# Patient Record
Sex: Female | Born: 2000 | Race: White | Marital: Single | Smoking: Never smoker
Health system: Southern US, Community
[De-identification: ages and names within clinical notes are randomized; demographics above are authoritative.]

## PROBLEM LIST (undated history)

## (undated) HISTORY — PX: KNEE ARTHROCENTESIS: SHX991

---

## 2020-03-10 DIAGNOSIS — F4321 Adjustment disorder with depressed mood: Secondary | ICD-10-CM | POA: Diagnosis not present

## 2020-03-16 DIAGNOSIS — F4321 Adjustment disorder with depressed mood: Secondary | ICD-10-CM | POA: Diagnosis not present

## 2020-03-21 DIAGNOSIS — J988 Other specified respiratory disorders: Secondary | ICD-10-CM | POA: Diagnosis not present

## 2020-03-23 DIAGNOSIS — F4321 Adjustment disorder with depressed mood: Secondary | ICD-10-CM | POA: Diagnosis not present

## 2020-03-24 DIAGNOSIS — R300 Dysuria: Secondary | ICD-10-CM | POA: Diagnosis not present

## 2020-03-24 DIAGNOSIS — R3129 Other microscopic hematuria: Secondary | ICD-10-CM | POA: Diagnosis not present

## 2020-03-24 DIAGNOSIS — R102 Pelvic and perineal pain: Secondary | ICD-10-CM | POA: Diagnosis not present

## 2020-03-24 DIAGNOSIS — R3914 Feeling of incomplete bladder emptying: Secondary | ICD-10-CM | POA: Diagnosis not present

## 2020-04-06 DIAGNOSIS — F4321 Adjustment disorder with depressed mood: Secondary | ICD-10-CM | POA: Diagnosis not present

## 2020-04-20 DIAGNOSIS — F4321 Adjustment disorder with depressed mood: Secondary | ICD-10-CM | POA: Diagnosis not present

## 2020-05-04 DIAGNOSIS — F4321 Adjustment disorder with depressed mood: Secondary | ICD-10-CM | POA: Diagnosis not present

## 2020-06-21 DIAGNOSIS — F4323 Adjustment disorder with mixed anxiety and depressed mood: Secondary | ICD-10-CM | POA: Diagnosis not present

## 2020-06-26 DIAGNOSIS — F4323 Adjustment disorder with mixed anxiety and depressed mood: Secondary | ICD-10-CM | POA: Diagnosis not present

## 2020-06-30 ENCOUNTER — Ambulatory Visit: Payer: BC Managed Care – PPO | Admitting: Adult Health

## 2020-07-03 DIAGNOSIS — S52124A Nondisplaced fracture of head of right radius, initial encounter for closed fracture: Secondary | ICD-10-CM | POA: Diagnosis not present

## 2020-07-03 DIAGNOSIS — M25421 Effusion, right elbow: Secondary | ICD-10-CM | POA: Diagnosis not present

## 2020-07-04 ENCOUNTER — Other Ambulatory Visit: Payer: Self-pay

## 2020-07-04 ENCOUNTER — Other Ambulatory Visit: Payer: Self-pay | Admitting: Physician Assistant

## 2020-07-04 ENCOUNTER — Ambulatory Visit
Admission: RE | Admit: 2020-07-04 | Discharge: 2020-07-04 | Disposition: A | Discharge status: Home / Self Care | Payer: BC Managed Care – PPO | Source: Ambulatory Visit | Attending: Physician Assistant | Admitting: Physician Assistant

## 2020-07-04 ENCOUNTER — Inpatient Hospital Stay
Admission: RE | Admit: 2020-07-04 | Discharge: 2020-07-04 | Disposition: A | Discharge status: Home / Self Care | Payer: BC Managed Care – PPO | Source: Ambulatory Visit | Attending: Orthopedic Surgery | Admitting: Orthopedic Surgery

## 2020-07-04 ENCOUNTER — Other Ambulatory Visit: Payer: Self-pay | Admitting: Orthopedic Surgery

## 2020-07-04 DIAGNOSIS — M25521 Pain in right elbow: Secondary | ICD-10-CM

## 2020-07-04 DIAGNOSIS — R937 Abnormal findings on diagnostic imaging of other parts of musculoskeletal system: Secondary | ICD-10-CM | POA: Diagnosis not present

## 2020-07-04 DIAGNOSIS — S52121A Displaced fracture of head of right radius, initial encounter for closed fracture: Secondary | ICD-10-CM | POA: Diagnosis not present

## 2020-07-04 DIAGNOSIS — M25021 Hemarthrosis, right elbow: Secondary | ICD-10-CM | POA: Diagnosis not present

## 2020-07-04 DIAGNOSIS — S52131A Displaced fracture of neck of right radius, initial encounter for closed fracture: Secondary | ICD-10-CM | POA: Diagnosis not present

## 2020-07-06 DIAGNOSIS — M25521 Pain in right elbow: Secondary | ICD-10-CM | POA: Diagnosis not present

## 2020-07-07 DIAGNOSIS — F4323 Adjustment disorder with mixed anxiety and depressed mood: Secondary | ICD-10-CM | POA: Diagnosis not present

## 2020-07-13 DIAGNOSIS — F4323 Adjustment disorder with mixed anxiety and depressed mood: Secondary | ICD-10-CM | POA: Diagnosis not present

## 2020-07-18 DIAGNOSIS — F4323 Adjustment disorder with mixed anxiety and depressed mood: Secondary | ICD-10-CM | POA: Diagnosis not present

## 2020-07-20 DIAGNOSIS — S52124D Nondisplaced fracture of head of right radius, subsequent encounter for closed fracture with routine healing: Secondary | ICD-10-CM | POA: Diagnosis not present

## 2020-07-25 DIAGNOSIS — F4323 Adjustment disorder with mixed anxiety and depressed mood: Secondary | ICD-10-CM | POA: Diagnosis not present

## 2020-08-01 DIAGNOSIS — F4323 Adjustment disorder with mixed anxiety and depressed mood: Secondary | ICD-10-CM | POA: Diagnosis not present

## 2020-08-08 DIAGNOSIS — F4323 Adjustment disorder with mixed anxiety and depressed mood: Secondary | ICD-10-CM | POA: Diagnosis not present

## 2020-08-14 DIAGNOSIS — F4323 Adjustment disorder with mixed anxiety and depressed mood: Secondary | ICD-10-CM | POA: Diagnosis not present

## 2020-08-17 DIAGNOSIS — S52124D Nondisplaced fracture of head of right radius, subsequent encounter for closed fracture with routine healing: Secondary | ICD-10-CM | POA: Diagnosis not present

## 2020-08-17 DIAGNOSIS — M25521 Pain in right elbow: Secondary | ICD-10-CM | POA: Diagnosis not present

## 2020-08-29 DIAGNOSIS — F4323 Adjustment disorder with mixed anxiety and depressed mood: Secondary | ICD-10-CM | POA: Diagnosis not present

## 2020-09-12 DIAGNOSIS — F4323 Adjustment disorder with mixed anxiety and depressed mood: Secondary | ICD-10-CM | POA: Diagnosis not present

## 2020-09-27 DIAGNOSIS — F4323 Adjustment disorder with mixed anxiety and depressed mood: Secondary | ICD-10-CM | POA: Diagnosis not present

## 2020-10-05 DIAGNOSIS — F4323 Adjustment disorder with mixed anxiety and depressed mood: Secondary | ICD-10-CM | POA: Diagnosis not present

## 2020-10-19 DIAGNOSIS — F4323 Adjustment disorder with mixed anxiety and depressed mood: Secondary | ICD-10-CM | POA: Diagnosis not present

## 2020-10-26 DIAGNOSIS — F4323 Adjustment disorder with mixed anxiety and depressed mood: Secondary | ICD-10-CM | POA: Diagnosis not present

## 2020-10-31 DIAGNOSIS — F4323 Adjustment disorder with mixed anxiety and depressed mood: Secondary | ICD-10-CM | POA: Diagnosis not present

## 2020-11-17 DIAGNOSIS — F4323 Adjustment disorder with mixed anxiety and depressed mood: Secondary | ICD-10-CM | POA: Diagnosis not present

## 2020-11-30 DIAGNOSIS — F4323 Adjustment disorder with mixed anxiety and depressed mood: Secondary | ICD-10-CM | POA: Diagnosis not present

## 2020-12-14 DIAGNOSIS — F4323 Adjustment disorder with mixed anxiety and depressed mood: Secondary | ICD-10-CM | POA: Diagnosis not present

## 2020-12-26 DIAGNOSIS — F4323 Adjustment disorder with mixed anxiety and depressed mood: Secondary | ICD-10-CM | POA: Diagnosis not present

## 2021-01-15 DIAGNOSIS — F4323 Adjustment disorder with mixed anxiety and depressed mood: Secondary | ICD-10-CM | POA: Diagnosis not present

## 2021-02-01 DIAGNOSIS — F4323 Adjustment disorder with mixed anxiety and depressed mood: Secondary | ICD-10-CM | POA: Diagnosis not present

## 2021-02-22 DIAGNOSIS — F4323 Adjustment disorder with mixed anxiety and depressed mood: Secondary | ICD-10-CM | POA: Diagnosis not present

## 2021-03-06 DIAGNOSIS — F4323 Adjustment disorder with mixed anxiety and depressed mood: Secondary | ICD-10-CM | POA: Diagnosis not present

## 2021-03-23 DIAGNOSIS — Z111 Encounter for screening for respiratory tuberculosis: Secondary | ICD-10-CM | POA: Diagnosis not present

## 2021-03-23 DIAGNOSIS — Z23 Encounter for immunization: Secondary | ICD-10-CM | POA: Diagnosis not present

## 2021-03-25 DIAGNOSIS — Z111 Encounter for screening for respiratory tuberculosis: Secondary | ICD-10-CM | POA: Diagnosis not present

## 2021-04-11 DIAGNOSIS — F4323 Adjustment disorder with mixed anxiety and depressed mood: Secondary | ICD-10-CM | POA: Diagnosis not present

## 2021-04-15 DIAGNOSIS — J029 Acute pharyngitis, unspecified: Secondary | ICD-10-CM | POA: Diagnosis not present

## 2021-04-22 DIAGNOSIS — Z20822 Contact with and (suspected) exposure to covid-19: Secondary | ICD-10-CM | POA: Diagnosis not present

## 2021-04-30 DIAGNOSIS — M791 Myalgia, unspecified site: Secondary | ICD-10-CM | POA: Diagnosis not present

## 2021-04-30 DIAGNOSIS — R6883 Chills (without fever): Secondary | ICD-10-CM | POA: Diagnosis not present

## 2021-04-30 DIAGNOSIS — J039 Acute tonsillitis, unspecified: Secondary | ICD-10-CM | POA: Diagnosis not present

## 2021-04-30 DIAGNOSIS — J029 Acute pharyngitis, unspecified: Secondary | ICD-10-CM | POA: Diagnosis not present

## 2021-07-19 DIAGNOSIS — H6691 Otitis media, unspecified, right ear: Secondary | ICD-10-CM | POA: Diagnosis not present

## 2021-07-19 DIAGNOSIS — J029 Acute pharyngitis, unspecified: Secondary | ICD-10-CM | POA: Diagnosis not present

## 2021-07-19 DIAGNOSIS — R051 Acute cough: Secondary | ICD-10-CM | POA: Diagnosis not present

## 2021-07-31 DIAGNOSIS — F4323 Adjustment disorder with mixed anxiety and depressed mood: Secondary | ICD-10-CM | POA: Diagnosis not present

## 2021-08-27 DIAGNOSIS — J039 Acute tonsillitis, unspecified: Secondary | ICD-10-CM | POA: Diagnosis not present

## 2021-09-04 DIAGNOSIS — F4323 Adjustment disorder with mixed anxiety and depressed mood: Secondary | ICD-10-CM | POA: Diagnosis not present

## 2021-10-15 DIAGNOSIS — F4323 Adjustment disorder with mixed anxiety and depressed mood: Secondary | ICD-10-CM | POA: Diagnosis not present

## 2021-10-31 DIAGNOSIS — D649 Anemia, unspecified: Secondary | ICD-10-CM | POA: Diagnosis not present

## 2021-10-31 DIAGNOSIS — Z23 Encounter for immunization: Secondary | ICD-10-CM | POA: Diagnosis not present

## 2021-10-31 DIAGNOSIS — L309 Dermatitis, unspecified: Secondary | ICD-10-CM | POA: Diagnosis not present

## 2021-10-31 DIAGNOSIS — Z13220 Encounter for screening for lipoid disorders: Secondary | ICD-10-CM | POA: Diagnosis not present

## 2021-10-31 DIAGNOSIS — Z0000 Encounter for general adult medical examination without abnormal findings: Secondary | ICD-10-CM | POA: Diagnosis not present

## 2021-11-23 ENCOUNTER — Telehealth: Payer: Self-pay | Admitting: Plastic Surgery

## 2021-11-23 NOTE — Telephone Encounter (Signed)
LVM to contact office to r/s appt to an earlier time asap.

## 2021-11-26 DIAGNOSIS — F4323 Adjustment disorder with mixed anxiety and depressed mood: Secondary | ICD-10-CM | POA: Diagnosis not present

## 2021-11-30 ENCOUNTER — Institutional Professional Consult (permissible substitution): Payer: BC Managed Care – PPO | Admitting: Plastic Surgery

## 2021-12-02 DIAGNOSIS — J069 Acute upper respiratory infection, unspecified: Secondary | ICD-10-CM | POA: Diagnosis not present

## 2021-12-02 DIAGNOSIS — J029 Acute pharyngitis, unspecified: Secondary | ICD-10-CM | POA: Diagnosis not present

## 2021-12-05 DIAGNOSIS — J029 Acute pharyngitis, unspecified: Secondary | ICD-10-CM | POA: Diagnosis not present

## 2021-12-05 DIAGNOSIS — B349 Viral infection, unspecified: Secondary | ICD-10-CM | POA: Diagnosis not present

## 2021-12-05 DIAGNOSIS — R0981 Nasal congestion: Secondary | ICD-10-CM | POA: Diagnosis not present

## 2021-12-05 DIAGNOSIS — R051 Acute cough: Secondary | ICD-10-CM | POA: Diagnosis not present

## 2021-12-26 DIAGNOSIS — H5213 Myopia, bilateral: Secondary | ICD-10-CM | POA: Diagnosis not present

## 2021-12-26 DIAGNOSIS — H52223 Regular astigmatism, bilateral: Secondary | ICD-10-CM | POA: Diagnosis not present

## 2021-12-30 DIAGNOSIS — J209 Acute bronchitis, unspecified: Secondary | ICD-10-CM | POA: Diagnosis not present

## 2021-12-30 DIAGNOSIS — J069 Acute upper respiratory infection, unspecified: Secondary | ICD-10-CM | POA: Diagnosis not present

## 2022-01-08 DIAGNOSIS — D649 Anemia, unspecified: Secondary | ICD-10-CM | POA: Diagnosis not present

## 2022-01-08 DIAGNOSIS — N920 Excessive and frequent menstruation with regular cycle: Secondary | ICD-10-CM | POA: Diagnosis not present

## 2022-01-08 DIAGNOSIS — R030 Elevated blood-pressure reading, without diagnosis of hypertension: Secondary | ICD-10-CM | POA: Diagnosis not present

## 2022-01-14 ENCOUNTER — Encounter: Payer: Self-pay | Admitting: Plastic Surgery

## 2022-01-14 ENCOUNTER — Ambulatory Visit: Payer: BC Managed Care – PPO | Admitting: Plastic Surgery

## 2022-01-14 VITALS — BP 134/75 | HR 72 | Resp 20 | Ht 68.0 in | Wt 306.0 lb

## 2022-01-14 DIAGNOSIS — L729 Follicular cyst of the skin and subcutaneous tissue, unspecified: Secondary | ICD-10-CM

## 2022-01-14 NOTE — Progress Notes (Signed)
   Referring Provider Aliene Beams, MD [id number] [place],  [place] [address]   CC:  Chief Complaint  Patient presents with   Consult      Virginia Perry is an 21 y.o. female.  HPI: Virginia Perry is a 21 year old female with a 5 mm x 5 mm cyst at her frontal hairline.  She states that this has been there for quite some time and that her mother has multiple cysts like this.  She request that it be removed  No Known Allergies  Outpatient Encounter Medications as of 01/14/2022  Medication Sig   ferrous sulfate 325 (65 FE) MG EC tablet Take 1 tablet by mouth daily.   No facility-administered encounter medications on file as of 01/14/2022.     No past medical history on file.    No family history on file.  Social History   Social History Narrative   Not on file     Review of Systems General: Denies fevers, chills, weight loss CV: Denies chest pain, shortness of breath, palpitations Skin: Cyst is noted.  Physical Exam    01/14/2022    9:00 AM  Vitals with BMI  Height 5\' 8"   Weight 306 lbs  BMI 46.54  Systolic 134  Diastolic 75  Pulse 72    General:  No acute distress,  Alert and oriented, Non-Toxic, Normal speech and affect Integument: Mobile cyst at the anterior hairline.  There is no punctum over the cyst that would indicate that this is a sebaceous cyst.  It is not fixed to the underlying tissue.  No erythema or drainage.  Assessment/Plan Cyst: Will plan to excise the cyst under local in the clinic.  We did discuss the possibility that she will have some hair loss in this area.  She understands and requested I proceed.  Will schedule  01/14/2022, 9:19 AM

## 2022-01-23 DIAGNOSIS — J40 Bronchitis, not specified as acute or chronic: Secondary | ICD-10-CM | POA: Diagnosis not present

## 2022-02-13 ENCOUNTER — Other Ambulatory Visit: Payer: Self-pay | Admitting: Plastic Surgery

## 2022-02-13 ENCOUNTER — Ambulatory Visit: Payer: BC Managed Care – PPO | Admitting: Plastic Surgery

## 2022-02-13 VITALS — BP 131/85 | HR 78

## 2022-02-13 DIAGNOSIS — L729 Follicular cyst of the skin and subcutaneous tissue, unspecified: Secondary | ICD-10-CM

## 2022-02-13 DIAGNOSIS — L7211 Pilar cyst: Secondary | ICD-10-CM

## 2022-02-13 NOTE — Progress Notes (Signed)
Procedure Note  Preoperative Dx: Scalp cyst  Postoperative Dx: Same  Procedure: Excision of scalp cyst  Anesthesia: Lidocaine 1% with 1:100,000 epinephrine and 0.25% Sensorcaine   Indication for Procedure: Removal of tissue for pathologic diagnosis  Description of Procedure: Risks and complications were explained to the patient including recurrence.  Consent was confirmed and the patient understands the risks and benefits.  The potential complications and alternatives were explained and the patient consents.  The patient expressed understanding the option of not having the procedure and the risks of a scar.  Time out was called and all information was confirmed to be correct.    The area was prepped and drapped.  Local anesthetic was injected in the subcutaneous tissues.  After waiting for the local to take affect an elliptical incision was made in the hairline over the cyst.  A combination of sharp and blunt dissection were used to identify and isolate the cyst.  The cyst was removed in 2 pieces and the entire cyst wall and contents were sent for pathologic examination.  After obtaining hemostasis, the surgical wound was closed with 5-0 Monocryl in the deep layer and interrupted 4-0 Prolene's in the skin.  The surgical wound measured 1 cm.  A dressing was applied.  The patient was given instructions on how to care for the area and a follow up appointment.  Virginia Perry tolerated the procedure well and there were no complications. The specimen was sent to pathology.  A follow-up will be arranged in 7 to 10 days for suture removal.

## 2022-02-21 ENCOUNTER — Ambulatory Visit: Payer: BC Managed Care – PPO | Admitting: Surgical

## 2022-02-21 ENCOUNTER — Encounter: Payer: Self-pay | Admitting: Surgical

## 2022-02-21 VITALS — BP 140/85 | HR 67

## 2022-02-21 DIAGNOSIS — L729 Follicular cyst of the skin and subcutaneous tissue, unspecified: Secondary | ICD-10-CM

## 2022-02-21 DIAGNOSIS — L7211 Pilar cyst: Secondary | ICD-10-CM

## 2022-02-21 NOTE — Progress Notes (Signed)
22 year old female here for follow-up after excision of forehead/scalp cyst with Dr. Lovena Le on 02/13/2022.  She reports she is doing well, she is not having any issues.  She has no specific complaints today.  Pathology has not yet resulted.  On exam left scalp incision is present, no surrounding erythema.  No tenderness noted.  Incision is well-healed.  2 Prolene sutures noted.  A/P:  Prolene sutures were removed, patient tolerated this well.  We discussed avoiding scrubbing this area for at least 1 more week.  No restrictions otherwise.  Recommend calling with questions or concerns.  Follow-up as needed.  We discussed that pathology results have not yet resulted, once they have and if there is any concerns we would notify the patient.  She reports that she also has access to her MyChart and if she has any questions will notify us about the results.

## 2022-02-25 DIAGNOSIS — R7989 Other specified abnormal findings of blood chemistry: Secondary | ICD-10-CM | POA: Diagnosis not present

## 2022-03-22 DIAGNOSIS — L509 Urticaria, unspecified: Secondary | ICD-10-CM | POA: Diagnosis not present

## 2022-03-22 DIAGNOSIS — K219 Gastro-esophageal reflux disease without esophagitis: Secondary | ICD-10-CM | POA: Diagnosis not present

## 2022-03-22 DIAGNOSIS — R058 Other specified cough: Secondary | ICD-10-CM | POA: Diagnosis not present

## 2022-03-24 ENCOUNTER — Other Ambulatory Visit: Payer: Self-pay

## 2022-03-24 ENCOUNTER — Emergency Department (HOSPITAL_BASED_OUTPATIENT_CLINIC_OR_DEPARTMENT_OTHER): Payer: BC Managed Care – PPO

## 2022-03-24 ENCOUNTER — Encounter (HOSPITAL_BASED_OUTPATIENT_CLINIC_OR_DEPARTMENT_OTHER): Payer: Self-pay

## 2022-03-24 ENCOUNTER — Emergency Department (HOSPITAL_BASED_OUTPATIENT_CLINIC_OR_DEPARTMENT_OTHER)
Admission: EM | Admit: 2022-03-24 | Discharge: 2022-03-24 | Disposition: A | Discharge status: Home / Self Care | Payer: BC Managed Care – PPO | Attending: Emergency Medicine | Admitting: Emergency Medicine

## 2022-03-24 DIAGNOSIS — M545 Low back pain, unspecified: Secondary | ICD-10-CM | POA: Diagnosis not present

## 2022-03-24 DIAGNOSIS — M79652 Pain in left thigh: Secondary | ICD-10-CM | POA: Insufficient documentation

## 2022-03-24 DIAGNOSIS — G5710 Meralgia paresthetica, unspecified lower limb: Secondary | ICD-10-CM

## 2022-03-24 DIAGNOSIS — M79651 Pain in right thigh: Secondary | ICD-10-CM | POA: Diagnosis not present

## 2022-03-24 DIAGNOSIS — N12 Tubulo-interstitial nephritis, not specified as acute or chronic: Secondary | ICD-10-CM | POA: Diagnosis not present

## 2022-03-24 DIAGNOSIS — M546 Pain in thoracic spine: Secondary | ICD-10-CM | POA: Insufficient documentation

## 2022-03-24 DIAGNOSIS — R1030 Lower abdominal pain, unspecified: Secondary | ICD-10-CM | POA: Diagnosis not present

## 2022-03-24 DIAGNOSIS — R109 Unspecified abdominal pain: Secondary | ICD-10-CM | POA: Insufficient documentation

## 2022-03-24 LAB — URINALYSIS, ROUTINE W REFLEX MICROSCOPIC
Bilirubin Urine: NEGATIVE
Glucose, UA: NEGATIVE mg/dL
Ketones, ur: NEGATIVE mg/dL
Nitrite: NEGATIVE
Protein, ur: 30 mg/dL — AB
RBC / HPF: >50 RBC/hpf (ref 0–5)
Specific Gravity, Urine: 1.013 (ref 1.005–1.030)
WBC, UA: >50 WBC/hpf (ref 0–5)
pH: 7 (ref 5.0–8.0)

## 2022-03-24 LAB — CBC
HCT: 35.7 % — ABNORMAL LOW (ref 36.0–46.0)
Hemoglobin: 11.6 g/dL — ABNORMAL LOW (ref 12.0–15.0)
MCH: 27.4 pg (ref 26.0–34.0)
MCHC: 32.5 g/dL (ref 30.0–36.0)
MCV: 84.4 fL (ref 80.0–100.0)
Platelets: 335 10*3/uL (ref 150–400)
RBC: 4.23 MIL/uL (ref 3.87–5.11)
RDW: 14.3 % (ref 11.5–15.5)
WBC: 10 10*3/uL (ref 4.0–10.5)
nRBC: 0 % (ref 0.0–0.2)

## 2022-03-24 LAB — BASIC METABOLIC PANEL
Anion gap: 7 (ref 5–15)
BUN: 14 mg/dL (ref 6–20)
CO2: 24 mmol/L (ref 22–32)
Calcium: 9.3 mg/dL (ref 8.9–10.3)
Chloride: 107 mmol/L (ref 98–111)
Creatinine, Ser: 0.8 mg/dL (ref 0.44–1.00)
GFR, Estimated: >60 mL/min (ref >60)
Glucose, Bld: 88 mg/dL (ref 70–99)
Potassium: 3.9 mmol/L (ref 3.5–5.1)
Sodium: 138 mmol/L (ref 135–145)

## 2022-03-24 LAB — DIFFERENTIAL
Abs Immature Granulocytes: 0.01 10*3/uL (ref 0.00–0.07)
Basophils Absolute: 0.1 10*3/uL (ref 0.0–0.1)
Basophils Relative: 1 %
Eosinophils Absolute: 0.3 10*3/uL (ref 0.0–0.5)
Eosinophils Relative: 3 %
Immature Granulocytes: 0 %
Lymphocytes Relative: 25 %
Lymphs Abs: 2.5 10*3/uL (ref 0.7–4.0)
Monocytes Absolute: 0.8 10*3/uL (ref 0.1–1.0)
Monocytes Relative: 8 %
Neutro Abs: 6.4 10*3/uL (ref 1.7–7.7)
Neutrophils Relative %: 63 %

## 2022-03-24 LAB — HCG, SERUM, QUALITATIVE: Preg, Serum: NEGATIVE

## 2022-03-24 MED ORDER — OXYCODONE-ACETAMINOPHEN 5-325 MG PO TABS
1.0000 | ORAL_TABLET | ORAL | Status: DC | PRN
  Administered 2022-03-24: 1 via ORAL
  Filled 2022-03-24: qty 1

## 2022-03-24 MED ORDER — CEPHALEXIN 250 MG PO CAPS
500.0000 mg | ORAL_CAPSULE | Freq: Once | ORAL | Status: AC
  Administered 2022-03-24: 500 mg via ORAL
  Filled 2022-03-24: qty 2

## 2022-03-24 MED ORDER — ONDANSETRON 4 MG PO TBDP
4.0000 mg | ORAL_TABLET | Freq: Once | ORAL | Status: AC
  Administered 2022-03-24: 4 mg via ORAL
  Filled 2022-03-24: qty 1

## 2022-03-24 MED ORDER — CEPHALEXIN 500 MG PO CAPS: 500.0000 mg | ORAL_CAPSULE | Freq: Four times a day (QID) | ORAL | 0 refills | Status: AC

## 2022-03-24 NOTE — ED Triage Notes (Signed)
Pt presents POV from home w/friend  Pt reports acute onset of low back pain this morning. Pt reports intermittent episodes wile at work through out the week. Pt is on her menstrual cycle but feels this pain is different. Pt denies any urinary symptoms, abnormal vaginal odor/dc. Pt reports pain radiated down both legs, increase pain to Right leg

## 2022-03-24 NOTE — ED Notes (Signed)
Patient ambulatory to restroom with steady gait.

## 2022-03-24 NOTE — ED Provider Notes (Signed)
CT scan abdomen without any acute findings.  Pelvic ultrasound negative as well.  Urinalysis looks like that is probably urinary tract infection.  Dr. Shirlee Limerick, treated as if it could be pyelonephritis.  No leukocytosis.  And renal function is normal.  Patient stable for discharge home.   Fredia Sorrow, MD 03/24/22 [phone]

## 2022-03-24 NOTE — ED Provider Notes (Signed)
[facility] Provider Note   CSN: [id number] Arrival date & time: 03/24/22  1219     History  Chief Complaint  Patient presents with   Back Pain    Virginia Perry is a 22 y.o. female.  Patient is a 22 yo female presenting for back pain. Pt admits to lower back pain that started while she was in the shower this morning while standing. Denies hx of recent back trauma, falls, heavy lifting, or MVAs. 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 Denies flank pain, dysuria, increased frequency, or urgency. Denies vaginal pain or vaginal discharge. Pt also admits to leg pain described in the bilateral anterior medial thighs that are also present and exacerbated by menstrual cycle. Currently on menstrual cycle.   The history is provided by the patient. No language interpreter was used.  Back Pain Associated symptoms: no abdominal pain, no chest pain, no dysuria and no fever        Home Medications Prior to Admission medications   Medication Sig Start Date End Date Taking? Authorizing Provider  ferrous sulfate 325 (65 FE) MG EC tablet Take 1 tablet by mouth daily. 11/07/21   [provider]      Allergies    Amoxicillin and Clindamycin    Review of Systems   Review of Systems  Constitutional:  Negative for chills and fever.  HENT:  Negative for ear pain and sore throat.   Eyes:  Negative for pain and visual disturbance.  Respiratory:  Negative for cough and shortness of breath.   Cardiovascular:  Negative for chest pain and palpitations.  Gastrointestinal:  Negative for abdominal pain and vomiting.  Genitourinary:  Positive for flank pain. Negative for dysuria and hematuria.  Musculoskeletal:  Positive for back pain. Negative for arthralgias.  Skin:  Negative for color change and rash.  Neurological:  Negative for seizures and syncope.  All other systems reviewed  and are negative.   Physical Exam Updated Vital Signs BP (!) 150/68   Pulse 64   Temp 98.3 F (36.8 C) (Oral)   Resp 16   LMP 03/24/2022 (Exact Date)   SpO2 97%  Physical Exam Vitals and nursing note reviewed.  Constitutional:      General: She is not in acute distress.    Appearance: She is well-developed.  HENT:     Head: Normocephalic and atraumatic.  Eyes:     Conjunctiva/sclera: Conjunctivae normal.  Cardiovascular:     Rate and Rhythm: Normal rate and regular rhythm.     Pulses:          Dorsalis pedis pulses are 2+ on the right side and 2+ on the left side.     Heart sounds: No murmur heard. Pulmonary:     Effort: Pulmonary effort is normal. No respiratory distress.     Breath sounds: Normal breath sounds.  Abdominal:     Palpations: Abdomen is soft.     Tenderness: There is no abdominal tenderness.  Musculoskeletal:        General: No swelling.     Cervical back: Neck supple. No tenderness or bony tenderness.     Thoracic back: Tenderness present. No bony tenderness.     Lumbar back: No tenderness or bony tenderness.       Back:  Skin:    General: Skin is warm and dry.  Capillary Refill: Capillary refill takes less than 2 seconds.  Neurological:     Mental Status: She is alert and oriented to person, place, and time.     GCS: GCS eye subscore is 4. GCS verbal subscore is 5. GCS motor subscore is 6.     Sensory: Sensation is intact.     Motor: Motor function is intact.  Psychiatric:        Mood and Affect: Mood normal.     ED Results / Procedures / Treatments   Labs (all labs ordered are listed, but only abnormal results are displayed) Labs Reviewed - No data to display  EKG None  Radiology No results found.  Procedures Procedures    Medications Ordered in ED Medications  oxyCODONE-acetaminophen (PERCOCET/ROXICET) 5-325 MG per tablet 1 tablet (1 tablet Oral Given 03/24/22 1313)  ondansetron (ZOFRAN-ODT) disintegrating tablet 4 mg (4 mg  Oral Given 03/24/22 1313)    ED Course/ Medical Decision Making/ A&P                             Medical Decision Making Amount and/or Complexity of Data Reviewed Labs: ordered. Radiology: ordered.  Risk Prescription drug management.   2:43 PM 22 yo female presenting for back pain. Pt admits to lower back pain that started while she was in the shower this morning while standing.  Differential diagnosis for acute onset left-sided back/neck pain includes but is not limited to kidney stone, nephritis, or musculoskeletal pain.  BMP creatinine, CT renal stone and urine analysis pending.  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  Pelvic ultrasound pending to rule out any large uterine fibroids compressing the nerve internally.  Patient signed out to oncoming provider while awaiting imaging.         Final Clinical Impression(s) / ED Diagnoses Final diagnoses:  None    Rx / DC Orders ED Discharge Orders     None         Lianne Cure, DO 25/36/64 1517

## 2022-03-24 NOTE — Discharge Instructions (Addendum)
You are diagnosed with a urinary tract infection and presenting with left-sided kidney pain.  We call this pyelonephritis which is a urine infection that travels to the kidneys.  You were given a prescription for an antibiotics called Keflex in the emergency department and a prescription was sent to your pharmacy.  Please take to completion and follow-up with your primary care physician if symptoms do not resolve after treatment.

## 2022-03-28 DIAGNOSIS — R109 Unspecified abdominal pain: Secondary | ICD-10-CM | POA: Diagnosis not present

## 2022-03-28 DIAGNOSIS — R399 Unspecified symptoms and signs involving the genitourinary system: Secondary | ICD-10-CM | POA: Diagnosis not present

## 2022-03-28 DIAGNOSIS — M545 Low back pain, unspecified: Secondary | ICD-10-CM | POA: Diagnosis not present

## 2022-04-02 DIAGNOSIS — H6993 Unspecified Eustachian tube disorder, bilateral: Secondary | ICD-10-CM | POA: Diagnosis not present

## 2022-04-07 DIAGNOSIS — N898 Other specified noninflammatory disorders of vagina: Secondary | ICD-10-CM | POA: Diagnosis not present

## 2022-04-19 DIAGNOSIS — N390 Urinary tract infection, site not specified: Secondary | ICD-10-CM | POA: Diagnosis not present

## 2022-04-24 DIAGNOSIS — N920 Excessive and frequent menstruation with regular cycle: Secondary | ICD-10-CM | POA: Diagnosis not present

## 2022-04-24 DIAGNOSIS — Z124 Encounter for screening for malignant neoplasm of cervix: Secondary | ICD-10-CM | POA: Diagnosis not present

## 2022-04-24 DIAGNOSIS — Z01411 Encounter for gynecological examination (general) (routine) with abnormal findings: Secondary | ICD-10-CM | POA: Diagnosis not present

## 2022-04-24 DIAGNOSIS — Z6841 Body Mass Index (BMI) 40.0 and over, adult: Secondary | ICD-10-CM | POA: Diagnosis not present

## 2022-05-10 DIAGNOSIS — M545 Low back pain, unspecified: Secondary | ICD-10-CM | POA: Diagnosis not present

## 2022-05-14 DIAGNOSIS — B379 Candidiasis, unspecified: Secondary | ICD-10-CM | POA: Diagnosis not present

## 2022-05-21 DIAGNOSIS — R0789 Other chest pain: Secondary | ICD-10-CM | POA: Diagnosis not present

## 2022-05-21 DIAGNOSIS — R5383 Other fatigue: Secondary | ICD-10-CM | POA: Diagnosis not present

## 2022-05-21 DIAGNOSIS — F411 Generalized anxiety disorder: Secondary | ICD-10-CM | POA: Diagnosis not present

## 2022-05-27 DIAGNOSIS — R1011 Right upper quadrant pain: Secondary | ICD-10-CM | POA: Diagnosis not present

## 2022-05-27 DIAGNOSIS — R3129 Other microscopic hematuria: Secondary | ICD-10-CM | POA: Diagnosis not present

## 2022-05-27 DIAGNOSIS — R102 Pelvic and perineal pain: Secondary | ICD-10-CM | POA: Diagnosis not present

## 2022-05-27 DIAGNOSIS — R3914 Feeling of incomplete bladder emptying: Secondary | ICD-10-CM | POA: Diagnosis not present

## 2022-06-11 DIAGNOSIS — F411 Generalized anxiety disorder: Secondary | ICD-10-CM | POA: Diagnosis not present

## 2022-06-11 DIAGNOSIS — K219 Gastro-esophageal reflux disease without esophagitis: Secondary | ICD-10-CM | POA: Diagnosis not present

## 2022-06-27 DIAGNOSIS — H6993 Unspecified Eustachian tube disorder, bilateral: Secondary | ICD-10-CM | POA: Diagnosis not present

## 2022-06-27 DIAGNOSIS — R0981 Nasal congestion: Secondary | ICD-10-CM | POA: Diagnosis not present

## 2022-06-27 DIAGNOSIS — J029 Acute pharyngitis, unspecified: Secondary | ICD-10-CM | POA: Diagnosis not present

## 2022-07-01 DIAGNOSIS — H65191 Other acute nonsuppurative otitis media, right ear: Secondary | ICD-10-CM | POA: Diagnosis not present

## 2022-07-10 IMAGING — CT CT ELBOW*R* W/O CM
1 series · 1 of 4 positions shown · non-contrast
Comparison: None.

CLINICAL DATA: Patient tripped and fell yesterday while hiking.
Radial neck fracture. No previous relevant surgery.

EXAM:
CT OF THE UPPER RIGHT EXTREMITY WITHOUT CONTRAST
3-DIMENSIONAL CT IMAGE RENDERING ON INDEPENDENT WORKSTATION
TECHNIQUE: Multidetector CT imaging of the right elbow was performed according
to the standard protocol. 3-dimensional CT images were rendered by
post-processing of the original CT data on an independent
workstation. The 3-dimensional CT images were interpreted and
findings were reported in the accompanying complete CT report for
this study

[Series 924: tumbles · 0.65mm/px · 1 of 4 slices shown]
[im 3/4]
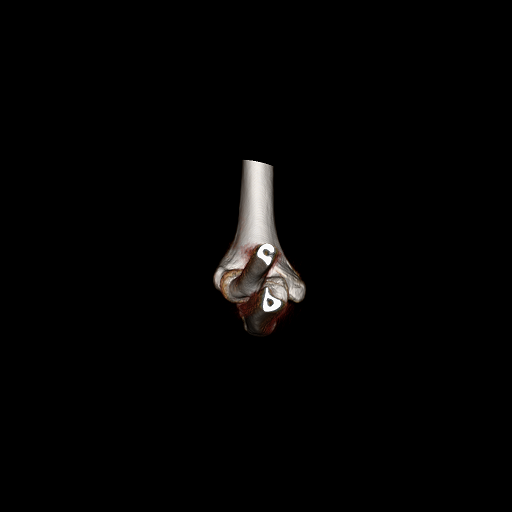

[1 of 4 positions shown; findings below may reference images not displayed]

FINDINGS: Bones/Joint/Cartilage

There is a minimally depressed fracture of the radial neck, best
seen on the sagittal images. This fracture does not involve the
articular surface of the radial head. The radius remains located.
The distal humerus and proximal ulna are intact. There is a
moderate-sized elbow joint effusion without evidence of
intra-articular loose body.

Ligaments

Suboptimally assessed by CT.

Muscles and Tendons

The biceps and triceps tendons appear intact. No muscular
abnormalities are identified.

Soft tissues

Mild dorsal subcutaneous edema. No soft tissue emphysema or foreign
body.
IMPRESSION: 1. Minimally depressed extra-articular fracture of the radial neck.
No involvement of the radial head articular surface or capitellum.
2. The distal humerus and proximal ulna appear intact.
3. Moderate size hemarthrosis.

## 2022-08-09 DIAGNOSIS — M545 Low back pain, unspecified: Secondary | ICD-10-CM | POA: Diagnosis not present

## 2022-08-09 DIAGNOSIS — R1013 Epigastric pain: Secondary | ICD-10-CM | POA: Diagnosis not present

## 2022-08-09 DIAGNOSIS — R1011 Right upper quadrant pain: Secondary | ICD-10-CM | POA: Diagnosis not present

## 2022-08-09 DIAGNOSIS — R350 Frequency of micturition: Secondary | ICD-10-CM | POA: Diagnosis not present

## 2022-08-10 ENCOUNTER — Emergency Department (HOSPITAL_BASED_OUTPATIENT_CLINIC_OR_DEPARTMENT_OTHER): Payer: BC Managed Care – PPO

## 2022-08-10 ENCOUNTER — Encounter (HOSPITAL_BASED_OUTPATIENT_CLINIC_OR_DEPARTMENT_OTHER): Payer: Self-pay

## 2022-08-10 ENCOUNTER — Emergency Department (HOSPITAL_BASED_OUTPATIENT_CLINIC_OR_DEPARTMENT_OTHER)
Admission: EM | Admit: 2022-08-10 | Discharge: 2022-08-10 | Disposition: A | Discharge status: Home / Self Care | Payer: BC Managed Care – PPO | Attending: Emergency Medicine | Admitting: Emergency Medicine

## 2022-08-10 DIAGNOSIS — R1031 Right lower quadrant pain: Secondary | ICD-10-CM | POA: Diagnosis not present

## 2022-08-10 DIAGNOSIS — R1011 Right upper quadrant pain: Secondary | ICD-10-CM | POA: Insufficient documentation

## 2022-08-10 LAB — CBC
HCT: 41.1 % (ref 36.0–46.0)
Hemoglobin: 13.6 g/dL (ref 12.0–15.0)
MCH: 28.6 pg (ref 26.0–34.0)
MCHC: 33.1 g/dL (ref 30.0–36.0)
MCV: 86.5 fL (ref 80.0–100.0)
Platelets: 371 10*3/uL (ref 150–400)
RBC: 4.75 MIL/uL (ref 3.87–5.11)
RDW: 12.6 % (ref 11.5–15.5)
WBC: 7.7 10*3/uL (ref 4.0–10.5)
nRBC: 0 % (ref 0.0–0.2)

## 2022-08-10 LAB — URINALYSIS, ROUTINE W REFLEX MICROSCOPIC
Bilirubin Urine: NEGATIVE
Glucose, UA: NEGATIVE mg/dL
Hgb urine dipstick: NEGATIVE
Ketones, ur: NEGATIVE mg/dL
Leukocytes,Ua: NEGATIVE
Nitrite: NEGATIVE
Specific Gravity, Urine: 1.027 (ref 1.005–1.030)
pH: 8.5 — ABNORMAL HIGH (ref 5.0–8.0)

## 2022-08-10 LAB — COMPREHENSIVE METABOLIC PANEL
ALT: 12 U/L (ref 0–44)
AST: 16 U/L (ref 15–41)
Albumin: 4.4 g/dL (ref 3.5–5.0)
Alkaline Phosphatase: 62 U/L (ref 38–126)
Anion gap: 8 (ref 5–15)
BUN: 12 mg/dL (ref 6–20)
CO2: 22 mmol/L (ref 22–32)
Calcium: 9.3 mg/dL (ref 8.9–10.3)
Chloride: 107 mmol/L (ref 98–111)
Creatinine, Ser: 0.73 mg/dL (ref 0.44–1.00)
GFR, Estimated: >60 mL/min (ref >60)
Glucose, Bld: 94 mg/dL (ref 70–99)
Potassium: 4.1 mmol/L (ref 3.5–5.1)
Sodium: 137 mmol/L (ref 135–145)
Total Bilirubin: 0.5 mg/dL (ref 0.3–1.2)
Total Protein: 7.2 g/dL (ref 6.5–8.1)

## 2022-08-10 LAB — LIPASE, BLOOD: Lipase: 57 U/L — ABNORMAL HIGH (ref 11–51)

## 2022-08-10 LAB — PREGNANCY, URINE: Preg Test, Ur: NEGATIVE

## 2022-08-10 MED ORDER — ONDANSETRON HCL 4 MG/2ML IJ SOLN
4.0000 mg | Freq: Once | INTRAMUSCULAR | Status: AC
  Administered 2022-08-10: 4 mg via INTRAVENOUS
  Filled 2022-08-10: qty 2

## 2022-08-10 MED ORDER — MORPHINE SULFATE (PF) 2 MG/ML IV SOLN
2.0000 mg | Freq: Once | INTRAVENOUS | Status: AC
  Administered 2022-08-10: 2 mg via INTRAVENOUS
  Filled 2022-08-10: qty 1

## 2022-08-10 MED ORDER — ACETAMINOPHEN 325 MG PO TABS: 650.0000 mg | ORAL_TABLET | Freq: Four times a day (QID) | ORAL | 0 refills | Status: AC | PRN

## 2022-08-10 MED ORDER — IOHEXOL 300 MG/ML  SOLN
100.0000 mL | Freq: Once | INTRAMUSCULAR | Status: AC | PRN
  Administered 2022-08-10: 100 mL via INTRAVENOUS

## 2022-08-10 NOTE — ED Notes (Signed)
Dc instructions reviewed with patient. Patient voiced understanding. Dc with belongings.  °

## 2022-08-10 NOTE — ED Provider Notes (Signed)
3:44 PM Patient signed out to me by previous ED physician. Pt is a 22 yo female presenting for ruq pain.  Labs pending Ruq [place] pending CT abd/pelvis stable   Physical Exam  BP 123/77   Pulse (!) 53   Temp 98.3 F (36.8 C)   Resp 16   Ht 5\' 8"  (1.727 m)   Wt 131.5 kg   LMP 07/13/2022   SpO2 100%   BMI 44.09 kg/m   Physical Exam  Procedures  Procedures  ED Course / MDM   Clinical Course as of 08/10/22 1544  Sat Aug 10, 2022  1437 CT abdomen pelvis reviewed interpreted and no evidence of cancer malady noted specifically with normal appendix per radiologist interpretation [DR]  1437 CBC reviewed and interpreted within normal limits Complete metabolic panel reviewed interpreted within normal limits Lipase elevated at 57 Pregnancy test is negative Analysis is significant for 11-20 squamous cells, [DR]  1437 Red blood cells 0-5, white blood cells 6-10 [DR]    Clinical Course User Index [DR] Margarita Grizzle, MD   Medical Decision Making Amount and/or Complexity of Data Reviewed Labs: ordered. Radiology: ordered.  Risk Prescription drug management.   ***

## 2022-08-10 NOTE — Discharge Instructions (Signed)
Please follow up with your OBGYN due to your concerns for endometreosis from previous conversations with them. CT abdomen and pelvis demonstrates no acute process today. Right upper quadrant Ultrasound demonstrates no acute process today. Blood work was stable. No sepsis. Normal liver profile.

## 2022-08-10 NOTE — ED Provider Notes (Addendum)
[place] EMERGENCY DEPARTMENT AT [facility] Provider Note   CSN: [id number] Arrival date & time: 08/10/22  1019     History  Chief Complaint  Patient presents with   Abdominal Pain    Virginia Perry is a 22 y.o. female.  HPI 22 yo female presents today complaining of right upper quadrant pain.  She reports that she has had pain for the past day.  She has nausea but no active vomiting.  Pain is worse with p.o. intake.Patient seen at pcp- [facility] walk in clinic yesterday.  Patient denies any sexual activity or chance of pregnancy.        Home Medications Prior to Admission medications   Medication Sig Start Date End Date Taking? Authorizing Provider  ferrous sulfate 325 (65 FE) MG EC tablet Take 1 tablet by mouth daily. 11/07/21   [provider]      Allergies    Amoxicillin and Clindamycin    Review of Systems   Review of Systems  Physical Exam Updated Vital Signs BP 123/63   Pulse 65   Temp 98.3 F (36.8 C)   Resp 16   Ht 1.727 m (5\' 8" )   Wt 131.5 kg   LMP 07/13/2022   SpO2 99%   BMI 44.09 kg/m  Physical Exam Vitals reviewed.  Constitutional:      Appearance: She is well-developed.  HENT:     Head: Normocephalic.     Mouth/Throat:     Mouth: Mucous membranes are moist.  Eyes:     Extraocular Movements: Extraocular movements intact.  Cardiovascular:     Rate and Rhythm: Normal rate and regular rhythm.     Heart sounds: Normal heart sounds.  Pulmonary:     Effort: Pulmonary effort is normal.     Breath sounds: Normal breath sounds.  Abdominal:     General: Abdomen is flat. Bowel sounds are normal.     Palpations: Abdomen is soft.     Tenderness: There is abdominal tenderness in the right upper quadrant and right lower quadrant.  Skin:    General: Skin is warm.     Capillary Refill: Capillary refill takes less than 2 seconds.  Neurological:     General: No focal deficit present.     Mental Status: She is alert.  Psychiatric:         Mood and Affect: Mood normal.     ED Results / Procedures / Treatments   Labs (all labs ordered are listed, but only abnormal results are displayed) Labs Reviewed  LIPASE, BLOOD - Abnormal; Notable for the following components:      Result Value   Lipase 57 (*)    All other components within normal limits  URINALYSIS, ROUTINE W REFLEX MICROSCOPIC - Abnormal; Notable for the following components:   APPearance HAZY (*)    pH 8.5 (*)    Protein, ur TRACE (*)    Bacteria, UA RARE (*)    All other components within normal limits  COMPREHENSIVE METABOLIC PANEL  CBC  PREGNANCY, URINE    EKG None  Radiology CT ABDOMEN PELVIS W CONTRAST  Result Date: 08/10/2022 CLINICAL DATA:  Right upper quadrant abdominal pain which radiates down abdomen. EXAM: CT ABDOMEN AND PELVIS WITH CONTRAST TECHNIQUE: Multidetector CT imaging of the abdomen and pelvis was performed using the standard protocol following bolus administration of intravenous contrast. RADIATION DOSE REDUCTION: This exam was performed according to the departmental dose-optimization program which includes automated exposure control, adjustment of the mA  and/or kV according to patient size and/or use of iterative reconstruction technique. CONTRAST:  OMNIPAQUE IOHEXOL 300 MG/ML  SOLN COMPARISON:  03/24/2022 FINDINGS: Lower chest: No acute abnormality. Hepatobiliary: No focal liver abnormality is seen. No gallstones, gallbladder wall thickening, or biliary dilatation. Pancreas: Unremarkable. No pancreatic ductal dilatation or surrounding inflammatory changes. Spleen: Normal in size without focal abnormality. Adrenals/Urinary Tract: Normal appearance of the adrenal glands. There are several small cyst within the right kidney measuring up to 1.3 cm. No follow-up imaging recommended. Left kidney is normal. No nephrolithiasis or hydronephrosis. Urinary bladder appears normal. Stomach/Bowel: Stomach appears normal. There is no pathologic  dilatation of the large or small bowel loops. The appendix is visualized and appears normal. No bowel wall thickening or inflammation identified. Vascular/Lymphatic: No significant vascular findings are present. No enlarged abdominal or pelvic lymph nodes. Reproductive: Uterus and bilateral adnexa are unremarkable. Other: No abdominal wall hernia or abnormality. No abdominopelvic ascites. Musculoskeletal: No acute or significant osseous findings. IMPRESSION: 1. No acute findings within the abdomen or pelvis. 2. The appendix is visualized and appears normal. Electronically Signed   By: Signa Kell M.D.   On: 08/10/2022 14:04    Procedures Procedures    Medications Ordered in ED Medications  morphine (PF) 2 MG/ML injection 2 mg (2 mg Intravenous Given 08/10/22 1329)  ondansetron (ZOFRAN) injection 4 mg (4 mg Intravenous Given 08/10/22 1327)  iohexol (OMNIPAQUE) 300 MG/ML solution 100 mL (100 mLs Intravenous Contrast Given 08/10/22 1348)    ED Course/ Medical Decision Making/ A&P Clinical Course as of 08/10/22 1516  Sat Aug 10, 2022  1437 CT abdomen pelvis reviewed interpreted and no evidence of cancer malady noted specifically with normal appendix per radiologist interpretation [DR]  1437 CBC reviewed and interpreted within normal limits Complete metabolic panel reviewed interpreted within normal limits Lipase elevated at 57 Pregnancy test is negative Analysis is significant for 11-20 squamous cells, [DR]  1437 Red blood cells 0-5, white blood cells 6-10 [DR]    Clinical Course User Index [DR] Margarita Grizzle, MD                             Medical Decision Making Amount and/or Complexity of Data Reviewed Labs: ordered. Radiology: ordered.  Risk Prescription drug management.   Differential diagnosis includes but is not limited to Acute cholecystitis Choledocholithiasis Pancreatitis Appendicitis Right Upper Quadrant   Biliary colic Cholangitis Cholecystitis Fitz-Hugh-Curtis  Syndrome-patient denies any sexual acivity Hepatitis Hepatic abscess Hepatic congestion Herpes zoster-no rash Mesenteric ischemia Perforated duodenal ulcer Pneumonia (RLL) Pulmonary embolism Pyelonephritis/nephrolithiasis   Patient evaluated with labs imaging CT negative [place] pending  Care discussed with Dr. Wallace Cullens who assumes care and will dispo when work up complete       Final Clinical Impression(s) / ED Diagnoses Final diagnoses:  Right upper quadrant abdominal pain    Rx / DC Orders ED Discharge Orders     None         Margarita Grizzle, MD 08/10/22 1525    Margarita Grizzle, MD 08/10/22 1557

## 2022-08-10 NOTE — ED Triage Notes (Signed)
Pt c/o RUQ pain that radiates down her abd . Pt states it kept her up overnight. Pt was seen at PCP yesterday and was inconclusive. Pt denies V/D, some nausea.

## 2022-08-19 DIAGNOSIS — R102 Pelvic and perineal pain: Secondary | ICD-10-CM | POA: Diagnosis not present

## 2022-10-15 DIAGNOSIS — F411 Generalized anxiety disorder: Secondary | ICD-10-CM | POA: Diagnosis not present

## 2022-10-22 DIAGNOSIS — Z0000 Encounter for general adult medical examination without abnormal findings: Secondary | ICD-10-CM | POA: Diagnosis not present

## 2022-10-22 DIAGNOSIS — Z13220 Encounter for screening for lipoid disorders: Secondary | ICD-10-CM | POA: Diagnosis not present

## 2022-10-22 DIAGNOSIS — Z114 Encounter for screening for human immunodeficiency virus [HIV]: Secondary | ICD-10-CM | POA: Diagnosis not present

## 2022-10-25 DIAGNOSIS — Z0000 Encounter for general adult medical examination without abnormal findings: Secondary | ICD-10-CM | POA: Diagnosis not present

## 2022-10-25 DIAGNOSIS — E782 Mixed hyperlipidemia: Secondary | ICD-10-CM | POA: Diagnosis not present

## 2022-10-25 DIAGNOSIS — Z6841 Body Mass Index (BMI) 40.0 and over, adult: Secondary | ICD-10-CM | POA: Diagnosis not present

## 2022-10-25 DIAGNOSIS — Z23 Encounter for immunization: Secondary | ICD-10-CM | POA: Diagnosis not present

## 2023-01-21 DIAGNOSIS — Z6841 Body Mass Index (BMI) 40.0 and over, adult: Secondary | ICD-10-CM | POA: Diagnosis not present

## 2023-01-21 DIAGNOSIS — R222 Localized swelling, mass and lump, trunk: Secondary | ICD-10-CM | POA: Diagnosis not present

## 2023-01-21 DIAGNOSIS — M545 Low back pain, unspecified: Secondary | ICD-10-CM | POA: Diagnosis not present

## 2023-01-21 DIAGNOSIS — R0681 Apnea, not elsewhere classified: Secondary | ICD-10-CM | POA: Diagnosis not present

## 2023-01-23 DIAGNOSIS — M5459 Other low back pain: Secondary | ICD-10-CM | POA: Diagnosis not present

## 2023-02-25 DIAGNOSIS — Z6841 Body Mass Index (BMI) 40.0 and over, adult: Secondary | ICD-10-CM | POA: Diagnosis not present

## 2023-02-25 DIAGNOSIS — J069 Acute upper respiratory infection, unspecified: Secondary | ICD-10-CM | POA: Diagnosis not present

## 2023-02-25 DIAGNOSIS — J029 Acute pharyngitis, unspecified: Secondary | ICD-10-CM | POA: Diagnosis not present

## 2023-04-18 DIAGNOSIS — L089 Local infection of the skin and subcutaneous tissue, unspecified: Secondary | ICD-10-CM | POA: Diagnosis not present

## 2023-04-18 DIAGNOSIS — L729 Follicular cyst of the skin and subcutaneous tissue, unspecified: Secondary | ICD-10-CM | POA: Diagnosis not present

## 2023-06-17 DIAGNOSIS — Z6841 Body Mass Index (BMI) 40.0 and over, adult: Secondary | ICD-10-CM | POA: Diagnosis not present

## 2023-06-17 DIAGNOSIS — Z01419 Encounter for gynecological examination (general) (routine) without abnormal findings: Secondary | ICD-10-CM | POA: Diagnosis not present

## 2023-06-17 DIAGNOSIS — Z1331 Encounter for screening for depression: Secondary | ICD-10-CM | POA: Diagnosis not present

## 2023-06-17 DIAGNOSIS — R030 Elevated blood-pressure reading, without diagnosis of hypertension: Secondary | ICD-10-CM | POA: Diagnosis not present

## 2023-09-10 DIAGNOSIS — N3010 Interstitial cystitis (chronic) without hematuria: Secondary | ICD-10-CM | POA: Diagnosis not present

## 2023-09-12 DIAGNOSIS — J0140 Acute pansinusitis, unspecified: Secondary | ICD-10-CM | POA: Diagnosis not present

## 2023-09-25 DIAGNOSIS — E282 Polycystic ovarian syndrome: Secondary | ICD-10-CM | POA: Diagnosis not present

## 2023-09-25 DIAGNOSIS — N939 Abnormal uterine and vaginal bleeding, unspecified: Secondary | ICD-10-CM | POA: Diagnosis not present

## 2023-10-10 DIAGNOSIS — N760 Acute vaginitis: Secondary | ICD-10-CM | POA: Diagnosis not present

## 2023-11-10 DIAGNOSIS — E782 Mixed hyperlipidemia: Secondary | ICD-10-CM | POA: Diagnosis not present

## 2023-11-10 DIAGNOSIS — Z0000 Encounter for general adult medical examination without abnormal findings: Secondary | ICD-10-CM | POA: Diagnosis not present

## 2023-11-14 DIAGNOSIS — Z6841 Body Mass Index (BMI) 40.0 and over, adult: Secondary | ICD-10-CM | POA: Diagnosis not present

## 2023-11-14 DIAGNOSIS — Z0000 Encounter for general adult medical examination without abnormal findings: Secondary | ICD-10-CM | POA: Diagnosis not present

## 2023-11-14 DIAGNOSIS — E782 Mixed hyperlipidemia: Secondary | ICD-10-CM | POA: Diagnosis not present

## 2023-11-14 DIAGNOSIS — Z23 Encounter for immunization: Secondary | ICD-10-CM | POA: Diagnosis not present

## 2023-12-09 DIAGNOSIS — H52223 Regular astigmatism, bilateral: Secondary | ICD-10-CM | POA: Diagnosis not present

## 2023-12-09 DIAGNOSIS — H5213 Myopia, bilateral: Secondary | ICD-10-CM | POA: Diagnosis not present

## 2024-02-01 DIAGNOSIS — J069 Acute upper respiratory infection, unspecified: Secondary | ICD-10-CM | POA: Diagnosis not present
